# Patient Record
Sex: Male | Born: 1995 | Hispanic: No | Marital: Single | Smoking: Never smoker
Health system: Southern US, Community
[De-identification: ages and names within clinical notes are randomized; demographics above are authoritative.]

## PROBLEM LIST (undated history)

## (undated) DIAGNOSIS — J45909 Unspecified asthma, uncomplicated: Secondary | ICD-10-CM

## (undated) HISTORY — DX: Unspecified asthma, uncomplicated: J45.909

---
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
## 2019-11-20 ENCOUNTER — Ambulatory Visit
Admission: RE | Admit: 2019-11-20 | Discharge: 2019-11-20 | Disposition: A | Discharge status: Home / Self Care | Payer: Medicaid Other | Source: Ambulatory Visit | Attending: Family Medicine | Admitting: Family Medicine

## 2019-11-20 ENCOUNTER — Other Ambulatory Visit: Payer: Self-pay | Admitting: Family Medicine

## 2019-11-20 ENCOUNTER — Other Ambulatory Visit: Payer: Self-pay

## 2019-11-20 DIAGNOSIS — M5440 Lumbago with sciatica, unspecified side: Secondary | ICD-10-CM

## 2021-07-20 ENCOUNTER — Emergency Department (HOSPITAL_COMMUNITY): Payer: Medicaid Other

## 2021-07-20 ENCOUNTER — Emergency Department (HOSPITAL_COMMUNITY)
Admission: EM | Admit: 2021-07-20 | Discharge: 2021-07-20 | Discharge status: Against Medical Advice | Payer: Medicaid Other | Attending: Emergency Medicine | Admitting: Emergency Medicine

## 2021-07-20 DIAGNOSIS — M791 Myalgia, unspecified site: Secondary | ICD-10-CM | POA: Diagnosis not present

## 2021-07-20 DIAGNOSIS — R059 Cough, unspecified: Secondary | ICD-10-CM | POA: Insufficient documentation

## 2021-07-20 DIAGNOSIS — R42 Dizziness and giddiness: Secondary | ICD-10-CM | POA: Diagnosis not present

## 2021-07-20 DIAGNOSIS — R0602 Shortness of breath: Secondary | ICD-10-CM | POA: Insufficient documentation

## 2021-07-20 DIAGNOSIS — Z5321 Procedure and treatment not carried out due to patient leaving prior to being seen by health care provider: Secondary | ICD-10-CM | POA: Insufficient documentation

## 2021-07-20 DIAGNOSIS — Z20822 Contact with and (suspected) exposure to covid-19: Secondary | ICD-10-CM | POA: Insufficient documentation

## 2021-07-20 DIAGNOSIS — R0981 Nasal congestion: Secondary | ICD-10-CM | POA: Insufficient documentation

## 2021-07-20 LAB — RESP PANEL BY RT-PCR (FLU A&B, COVID) ARPGX2
Influenza A by PCR: NEGATIVE
Influenza B by PCR: NEGATIVE
SARS Coronavirus 2 by RT PCR: NEGATIVE

## 2021-07-20 NOTE — ED Notes (Signed)
NAx4 for vitals 

## 2021-07-20 NOTE — ED Triage Notes (Signed)
Pt c/o cough, congestion, SHOB x1wk. States he began tasting blood when coughing, noticed blood when coughing/blowing nose this AM. Endorses brief episode of dizziness this AM, generalized bodyaches on Monday. Denies known sick contact ?

## 2021-07-20 NOTE — ED Provider Triage Note (Signed)
Emergency Medicine Provider Triage Evaluation Note ? ?Bradley Mann , a 26 y.o. male  was evaluated in triage.  Pt complains of cough, congestion body aches for 1 week. No hemoptysis. ? ?Review of Systems  ?Positive: Cough, congestion ?Negative: Fever  ? ?Physical Exam  ?BP 121/85 (BP Location: Left Arm)   Pulse (!) 107   Temp 98.9 ?F (37.2 ?C) (Oral)   Resp 16   SpO2 97%  ?Gen:   Awake, no distress   ?Resp:  Normal effort  ?MSK:   Moves extremities without difficulty  ?Other:   ? ?Medical Decision Making  ?Medically screening exam initiated at 6:02 PM.  Appropriate orders placed.  Bradley Mann was informed that the remainder of the evaluation will be completed by another provider, this initial triage assessment does not replace that evaluation, and the importance of remaining in the ED until their evaluation is complete. ? ?COVID and CXR ekg ?  ?Gailen Shelter, [place] ?07/20/21 1803 ? ?

## 2022-03-19 IMAGING — CR DG LUMBAR SPINE COMPLETE 4+V
5 series · 5 of 5 positions shown · non-contrast
Comparison: None.

CLINICAL DATA: Low back pain

EXAM:
LUMBAR SPINE - COMPLETE 4+ VIEW

[t lumbar spine ap]
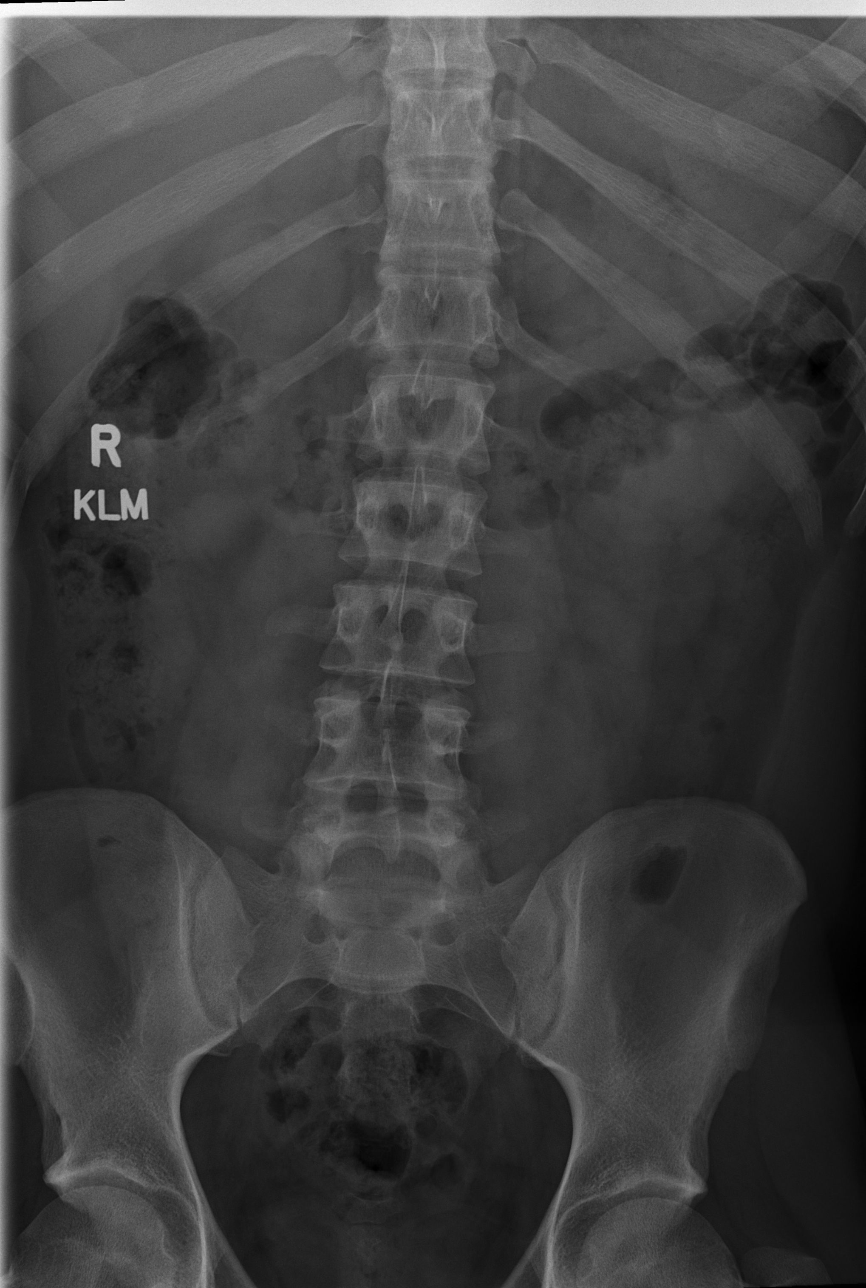

[t lumbar spine obl (1 of 2)]
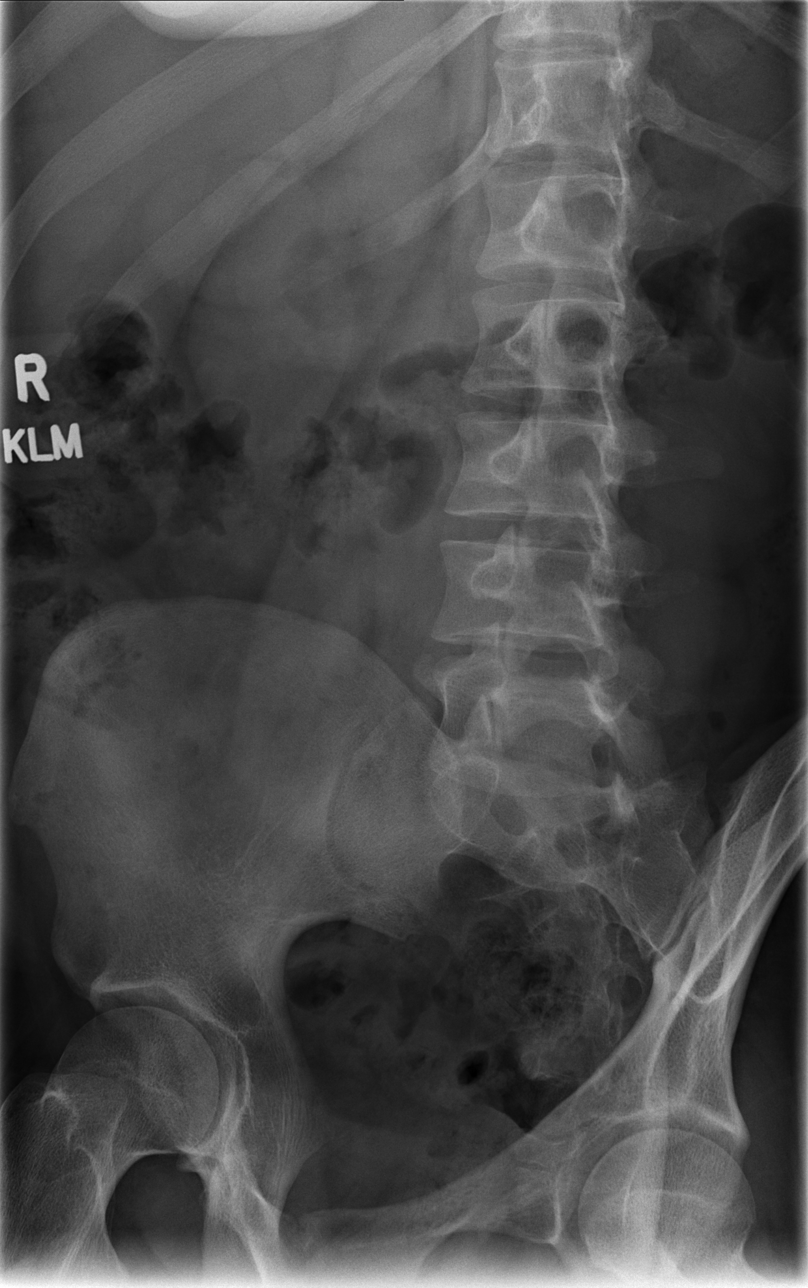

[t lumbar spine obl (2 of 2)]
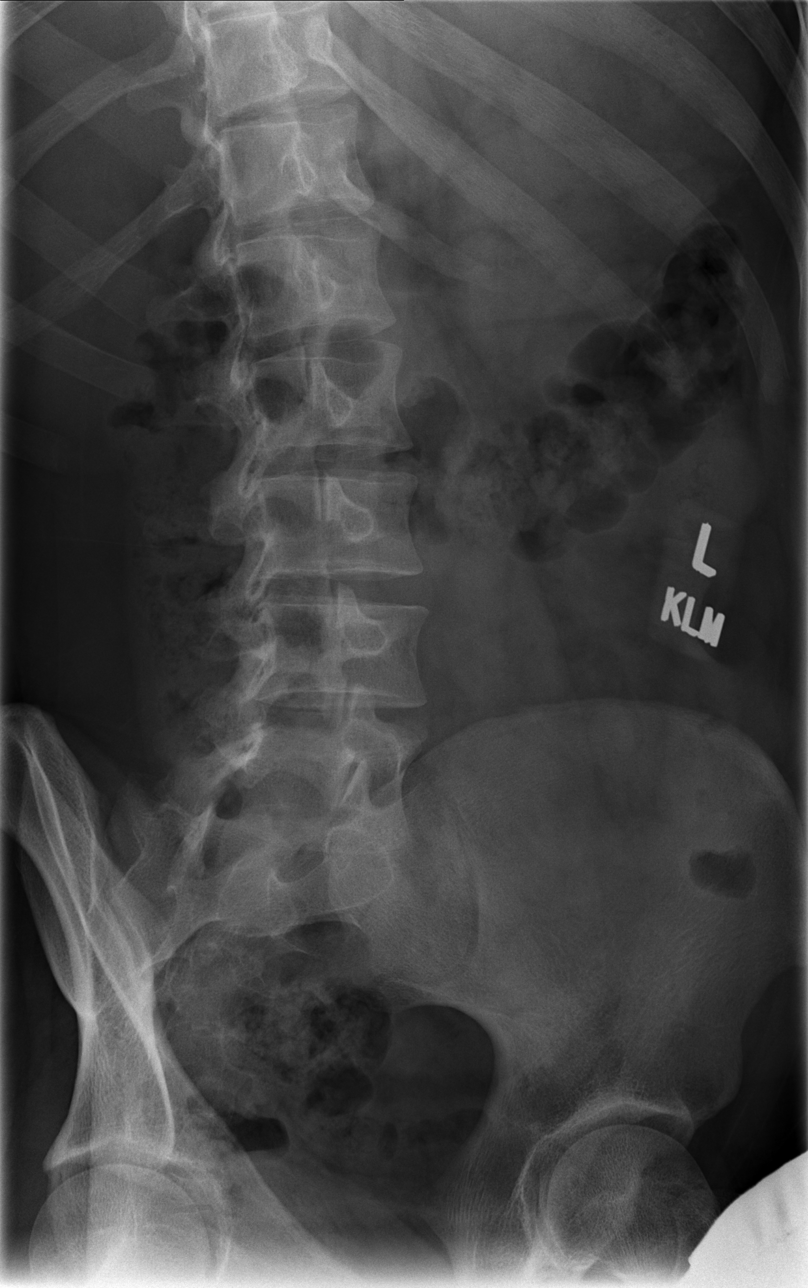

[t lumbar spine lat]
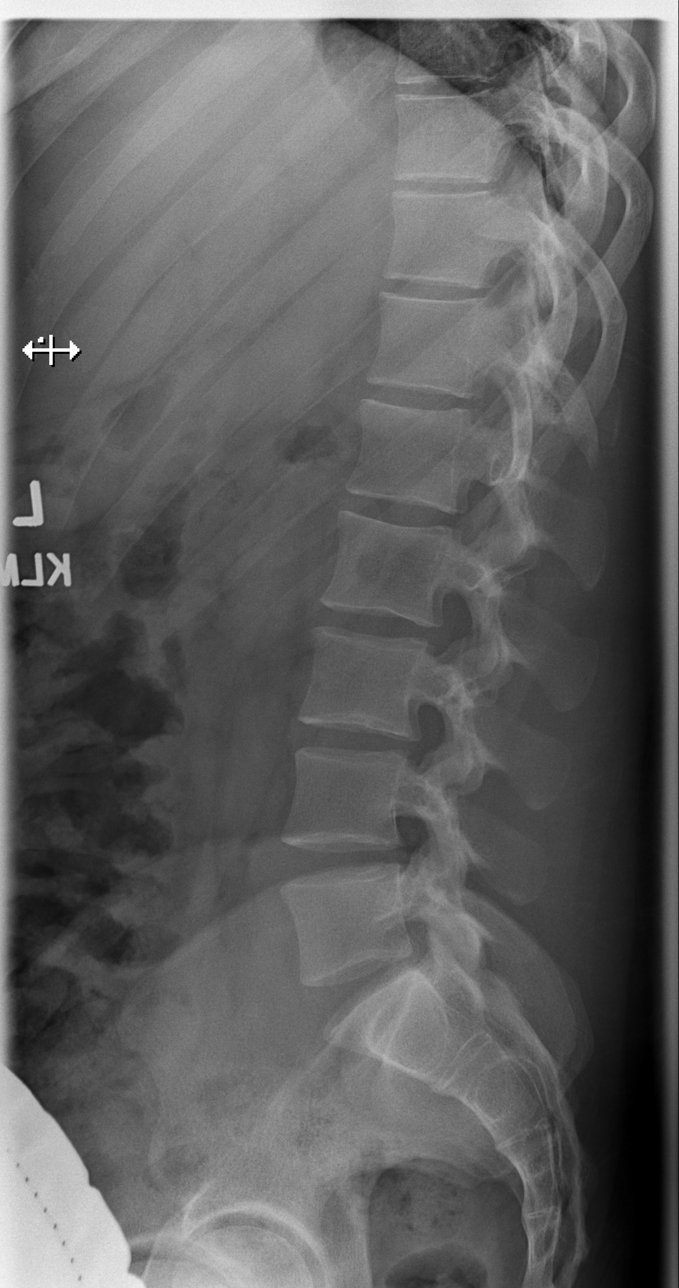

[t lumbar l-5 s-1 spot]
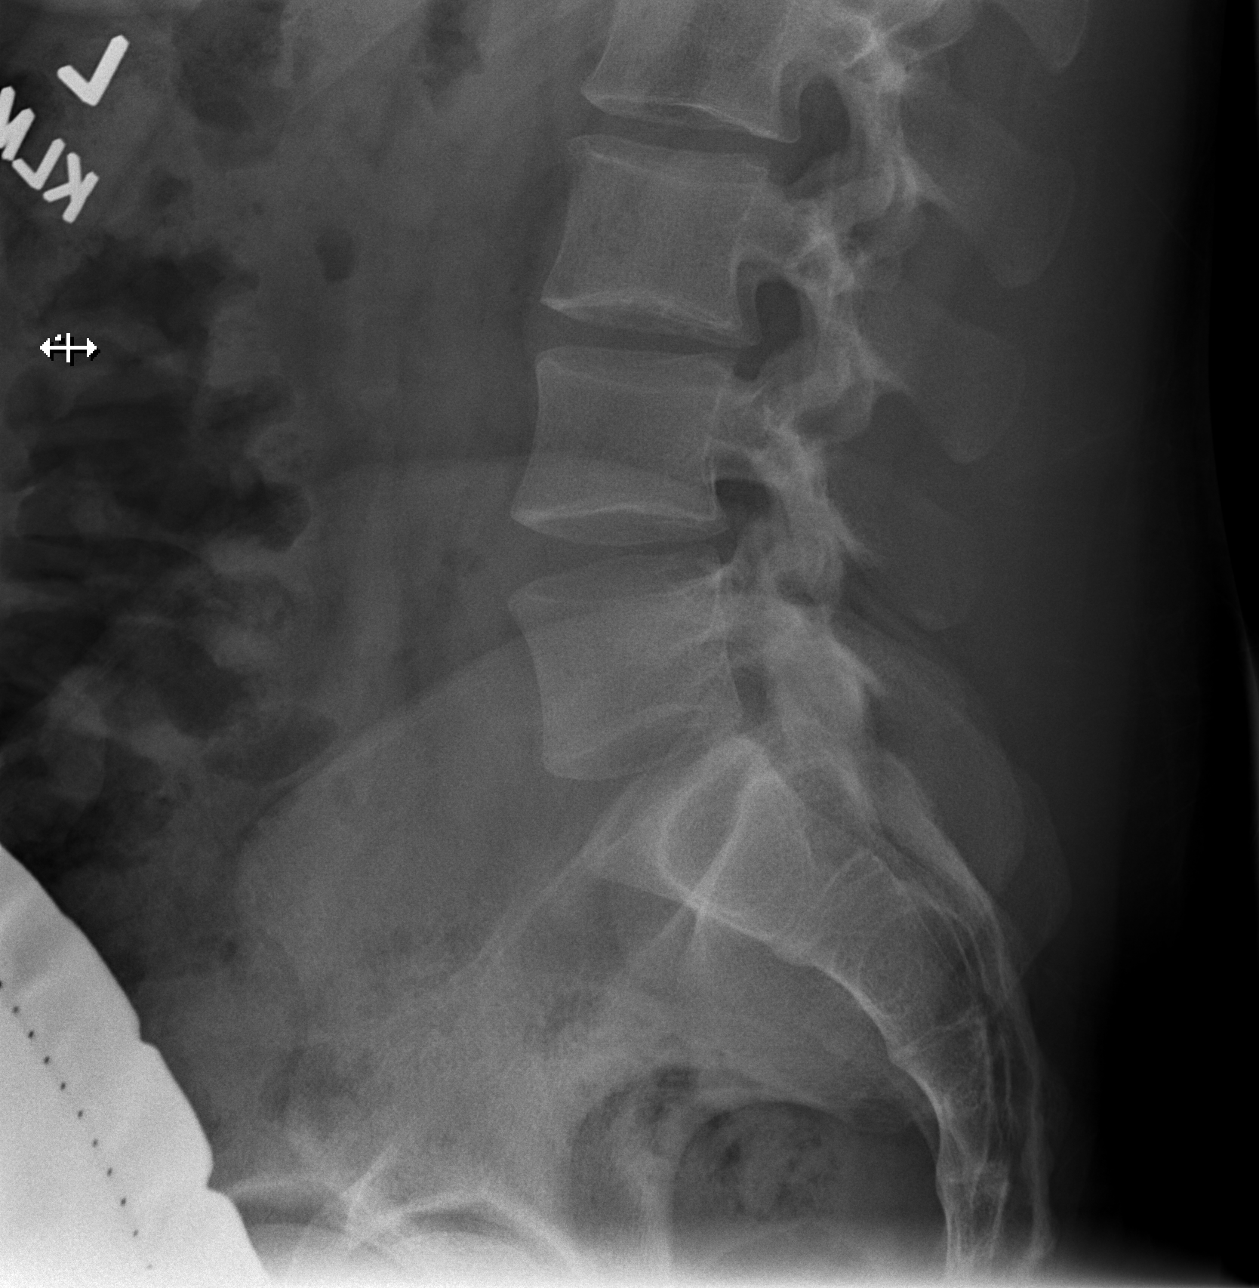

[5 of 5 positions shown; findings below may reference images not displayed]

FINDINGS: There is no evidence of lumbar spine fracture. Alignment is normal.
Intervertebral disc spaces are maintained.
IMPRESSION: Negative.
